# Patient Record
Sex: Female | Born: 1980 | Hispanic: Yes | Marital: Married
Health system: Southern US, Community
[De-identification: ages and names within clinical notes are randomized; demographics above are authoritative.]

---

## 2005-12-29 ENCOUNTER — Ambulatory Visit: Payer: Self-pay | Admitting: Family Medicine

## 2006-05-18 ENCOUNTER — Inpatient Hospital Stay: Payer: Self-pay

## 2010-10-07 ENCOUNTER — Emergency Department: Payer: Self-pay | Admitting: Emergency Medicine

## 2011-10-17 ENCOUNTER — Ambulatory Visit: Payer: Self-pay | Admitting: Family Medicine

## 2011-12-02 IMAGING — US US OB < 14 WEEKS - US OB TV
1 series · 17 of 28 positions shown · non-contrast
Comparison: none

REASON FOR EXAM: vaginal bleeding
COMMENTS:

[Series 1: us ob < 14 weeks - us ob tv · 17 of 38 slices shown]
[im 1/38]
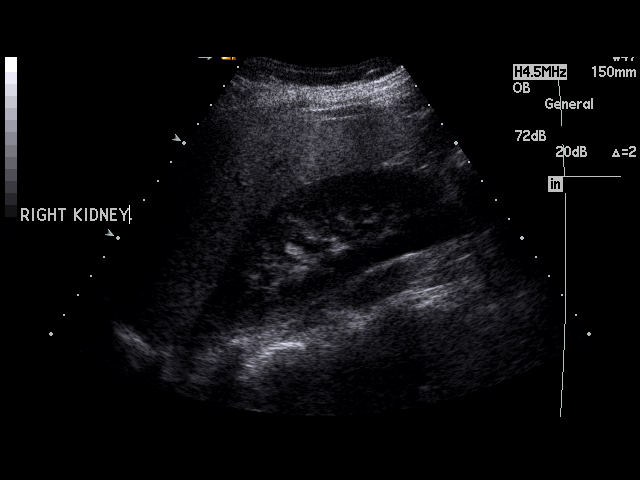
[im 3/38]
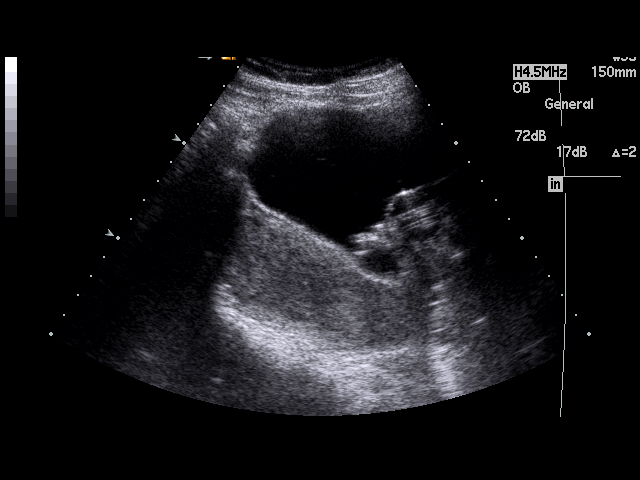
[im 6/38]
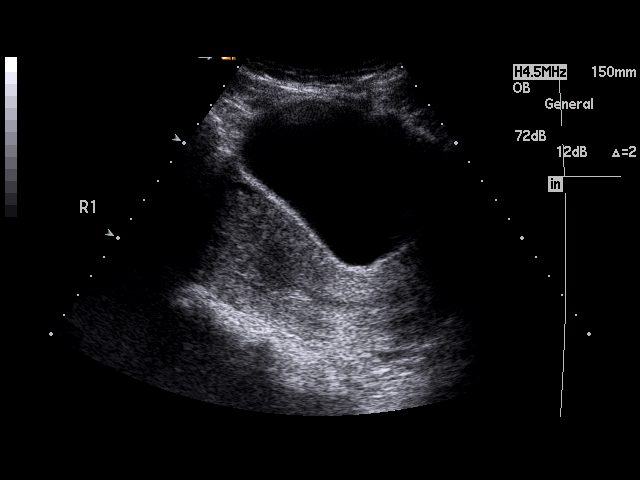
[im 7/38]
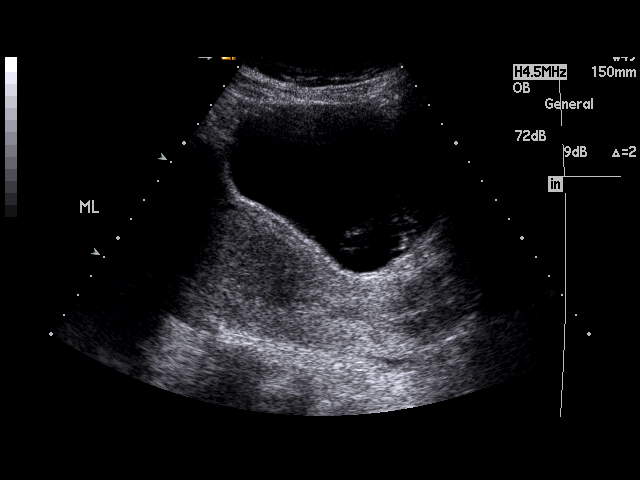
[im 10/38]
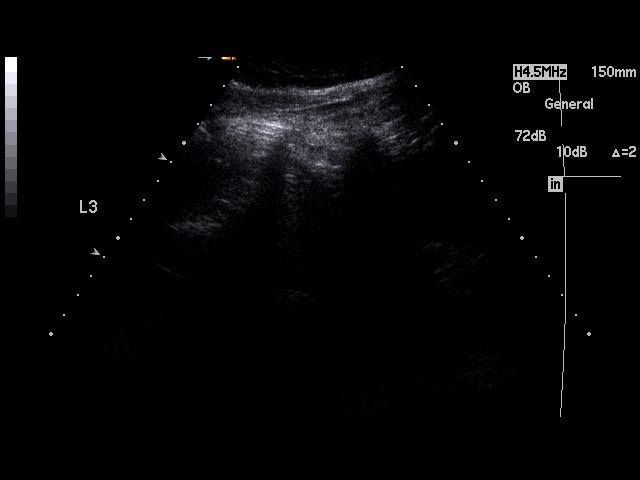
[im 13/38]
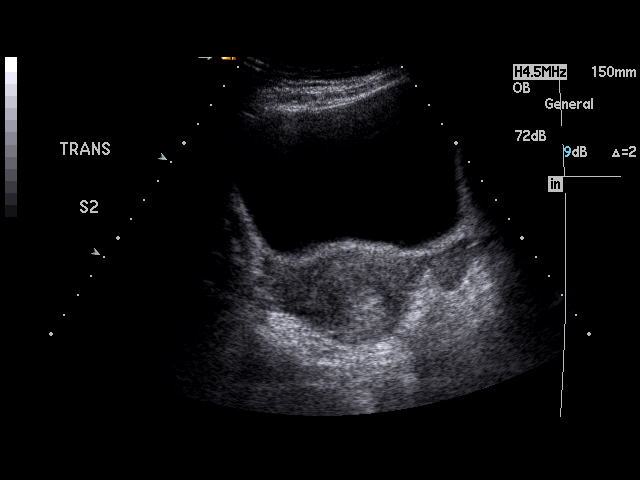
[im 14/38]
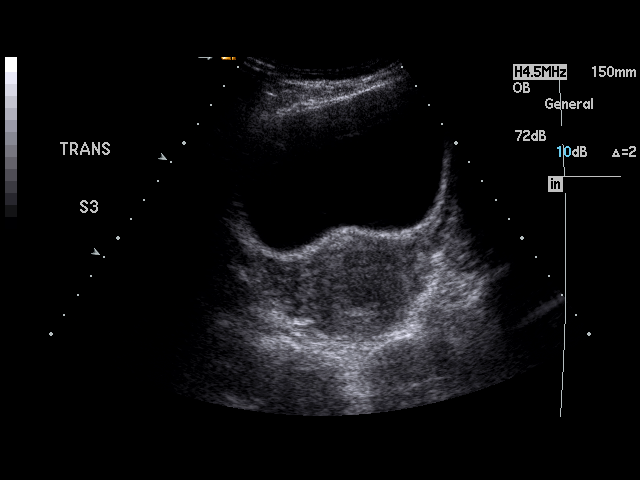
[im 17/38]
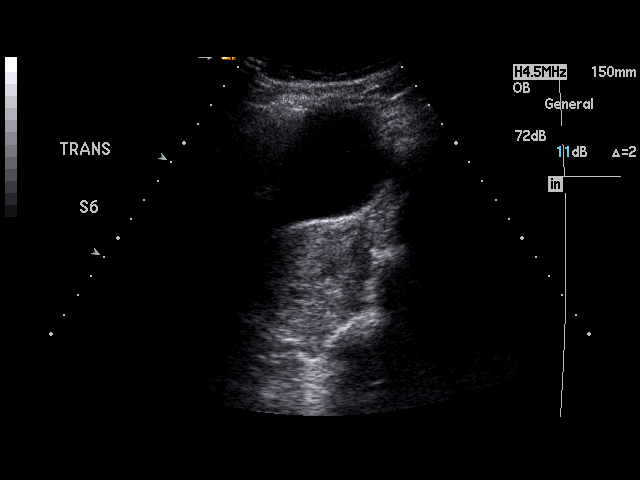
[im 20/38]
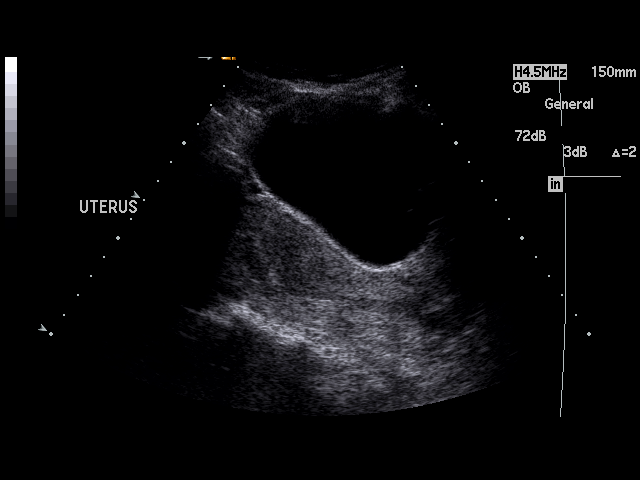
[im 21/38]
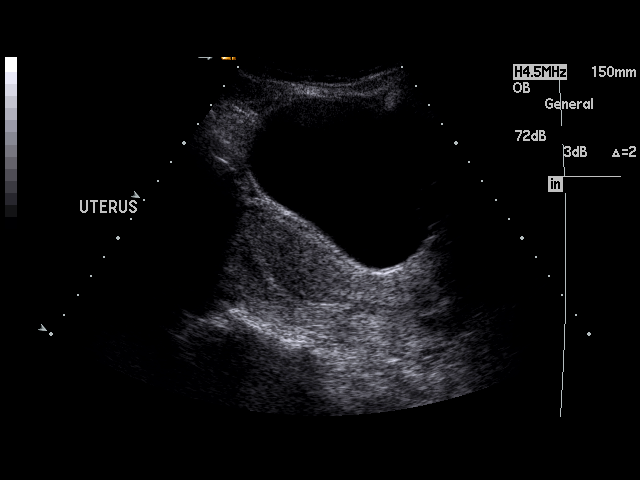
[im 24/38]
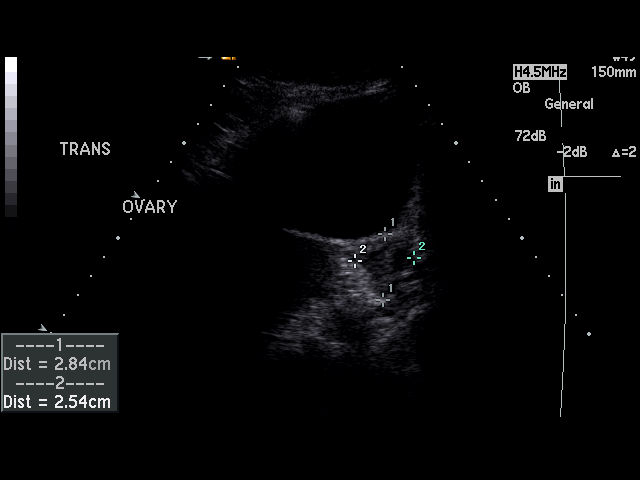
[im 25/38]
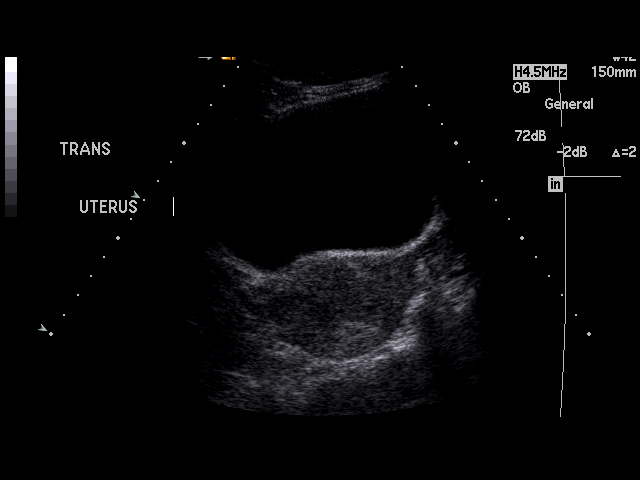
[im 28/38]
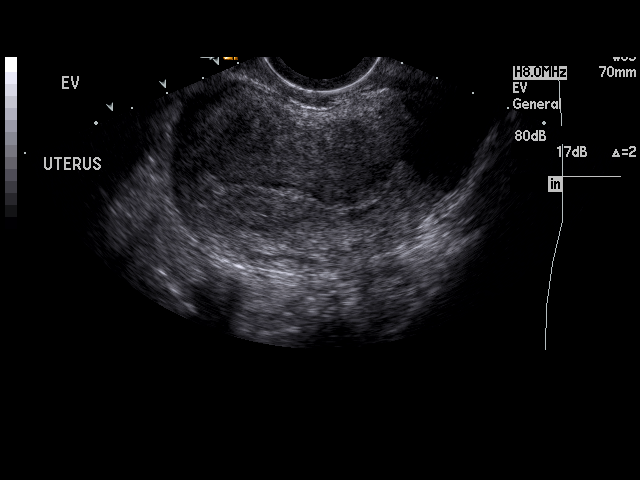
[im 31/38]
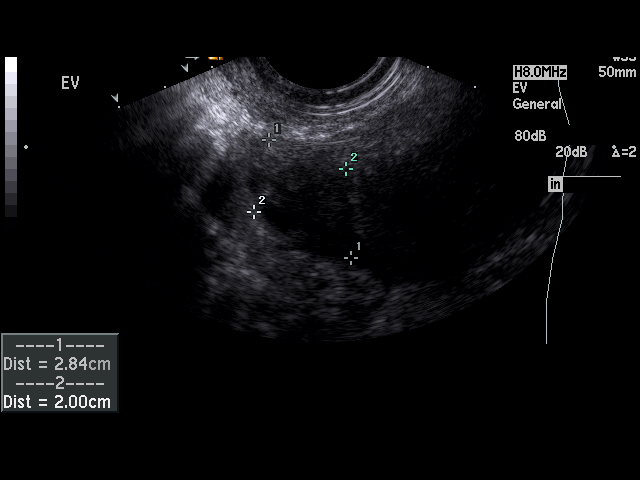
[im 32/38]
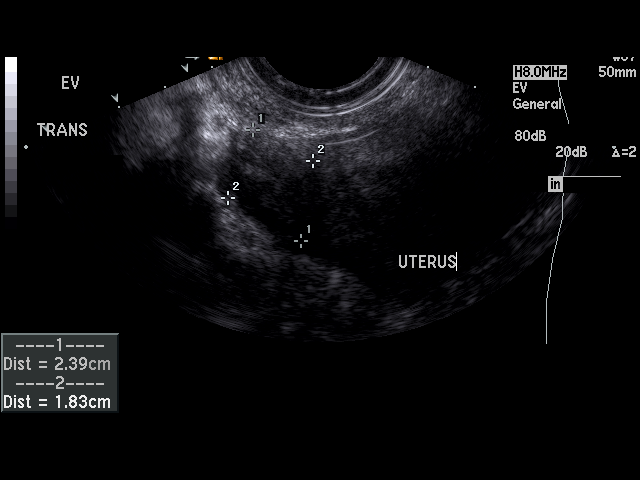
[im 35/38]
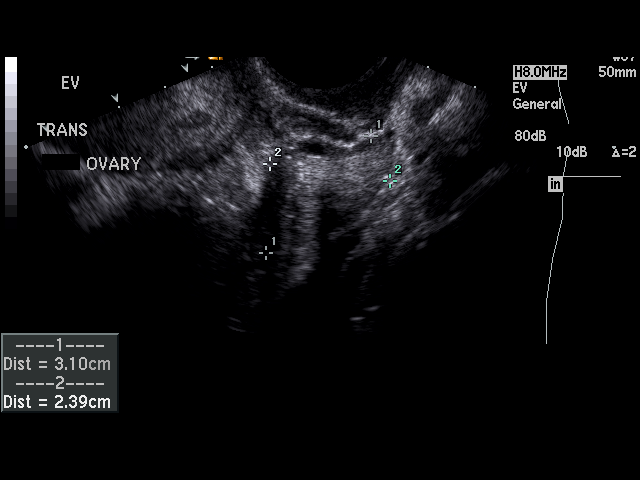
[im 38/38]
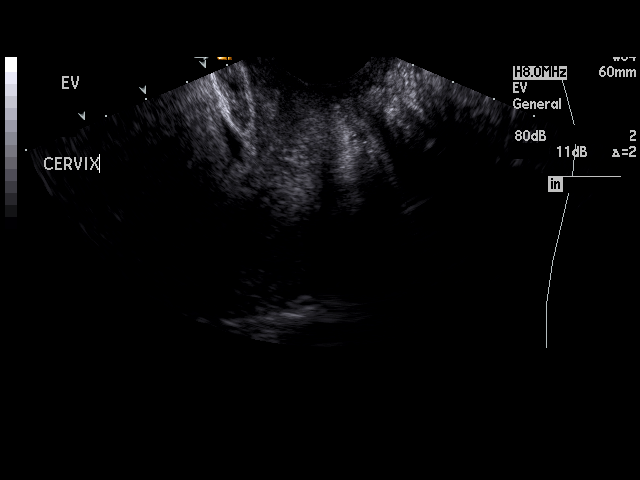

[17 of 28 positions shown; findings below may reference images not displayed]

PROCEDURE:     US  - US OB LESS THAN 14 WEEKS/W TRANS  - October 07, 2010 [DATE]

RESULT:     Pelvic ultrasound is performed utilizing transabdominal and
endovaginal scanning. The endometrial stripe is 12 mm thick. There is a
small amount of fluid or debris within the endometrial canal. No gestational
sac is seen within the endometrial cavity. The ovaries appear within normal
limits of size and echotexture as does the uterus. The kidneys appear to be
unremarkable. Doppler interrogation of the ovaries is not documented. The
uterine size was not measured.
IMPRESSION: No identifiable gestation. Given the findings of the
positive beta-hCG, a recent abortion or ectopic gestation should be
considered. Serial quantitative beta-hCG would be recommended along with
possible follow-up ultrasound. OB consultation is suggested.

## 2011-12-30 ENCOUNTER — Ambulatory Visit: Payer: Self-pay | Admitting: Family Medicine

## 2012-04-06 ENCOUNTER — Ambulatory Visit: Payer: Self-pay | Admitting: Advanced Practice Midwife

## 2012-06-18 ENCOUNTER — Inpatient Hospital Stay: Payer: Self-pay

## 2012-06-18 LAB — CBC WITH DIFFERENTIAL/PLATELET
Basophil #: 0 x10 3/mm 3
Basophil %: 0.2 %
Eosinophil #: 0.1 x10 3/mm 3
Eosinophil %: 1.1 %
HCT: 37.6 %
HGB: 12.7 g/dL
Lymphocyte %: 23.3 %
Lymphs Abs: 2.6 x10 3/mm 3
MCH: 28.3 pg
MCHC: 33.9 g/dL
MCV: 83 fL
Monocyte #: 1 "x10 3/mm " — ABNORMAL HIGH
Monocyte %: 8.8 %
Neutrophil #: 7.5 x10 3/mm 3 — ABNORMAL HIGH
Neutrophil %: 66.6 %
Platelet: 147 x10 3/mm 3 — ABNORMAL LOW
RBC: 4.51 X10 6/mm 3
RDW: 17.1 % — ABNORMAL HIGH
WBC: 11.3 x10 3/mm 3 — ABNORMAL HIGH

## 2012-06-19 LAB — HEMATOCRIT: HCT: 33.8 % — ABNORMAL LOW (ref 35.0–47.0)

## 2013-02-23 IMAGING — US US OB US >=[ID] SNGL FETUS
1 of 2 series · 13 of 28 positions shown · non-contrast
Comparison: none

REASON FOR EXAM: dating
COMMENTS:

[Series 1: us ob us >=(id) sngl fetus · 0.37mm/px · 13 of 54 slices shown]
[im 3/54]
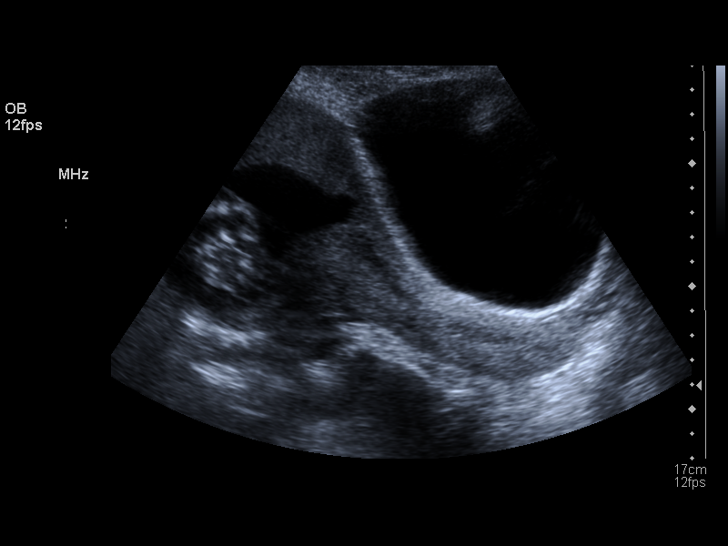
[im 7/54]
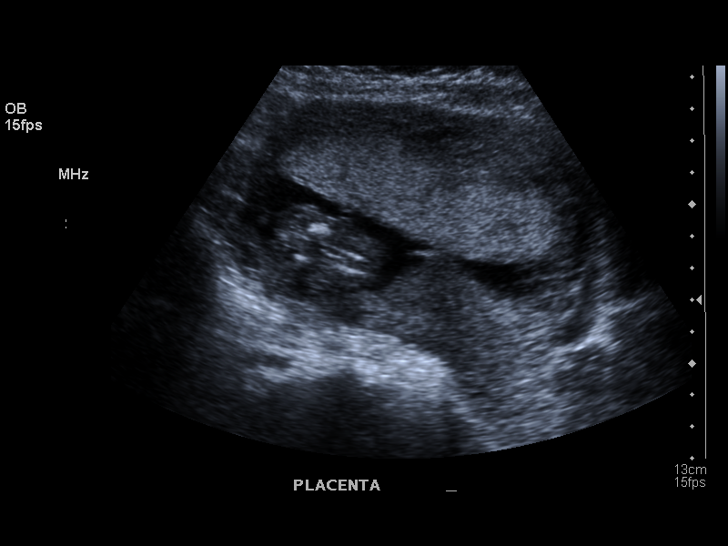
[im 11/54]
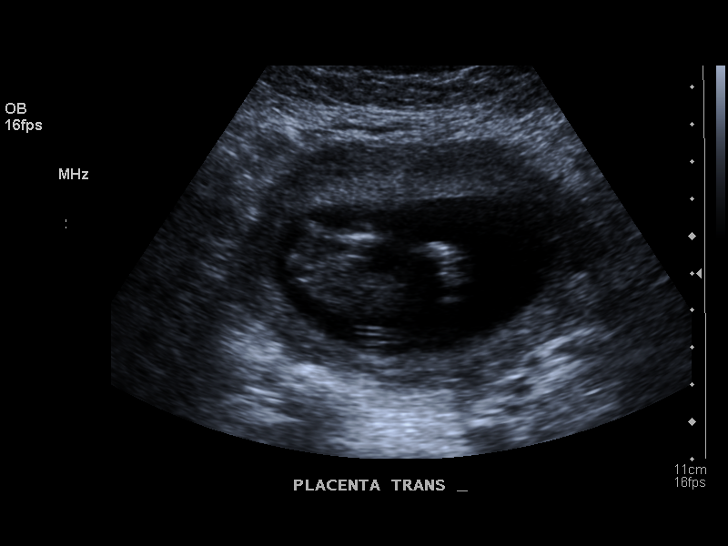
[im 15/54]
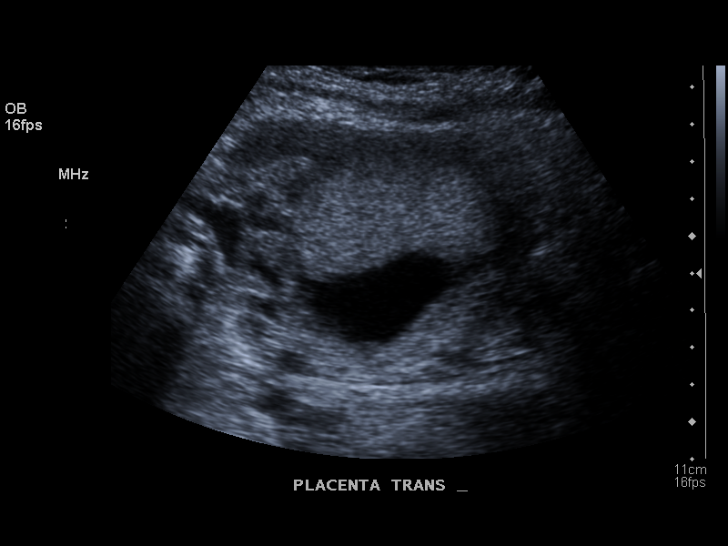
[im 19/54]
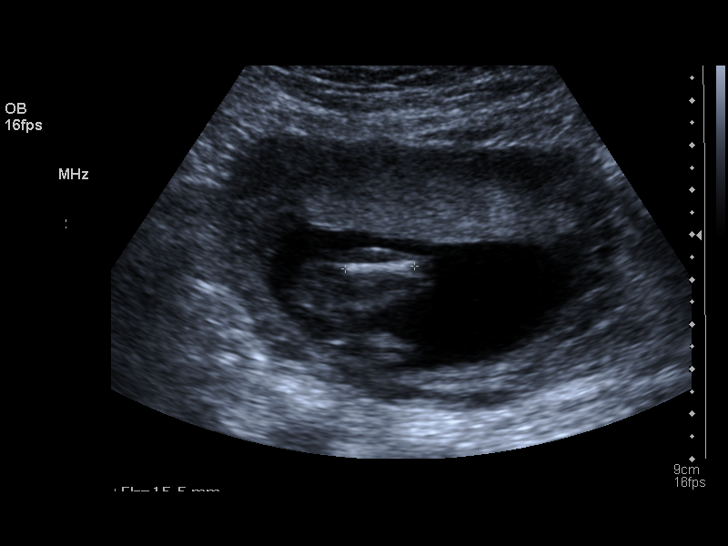
[im 23/54]
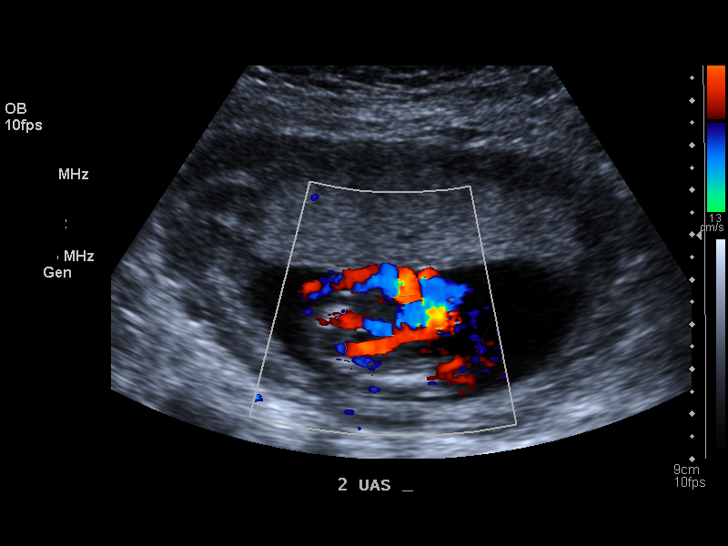
[im 29/54]
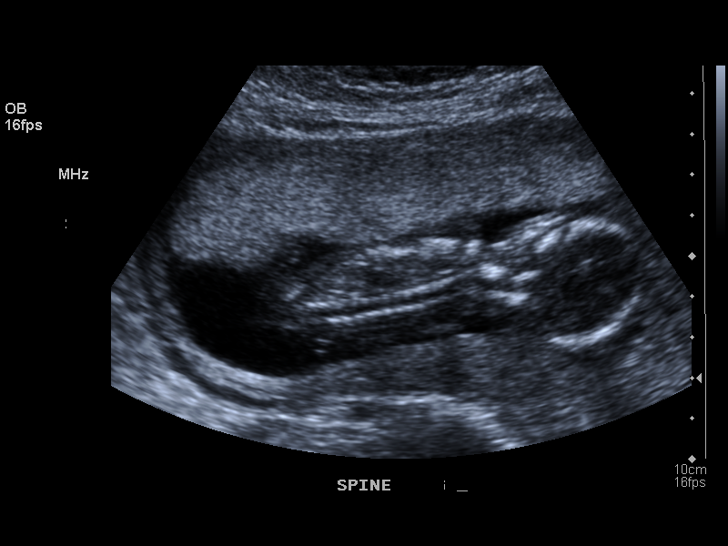
[im 33/54]
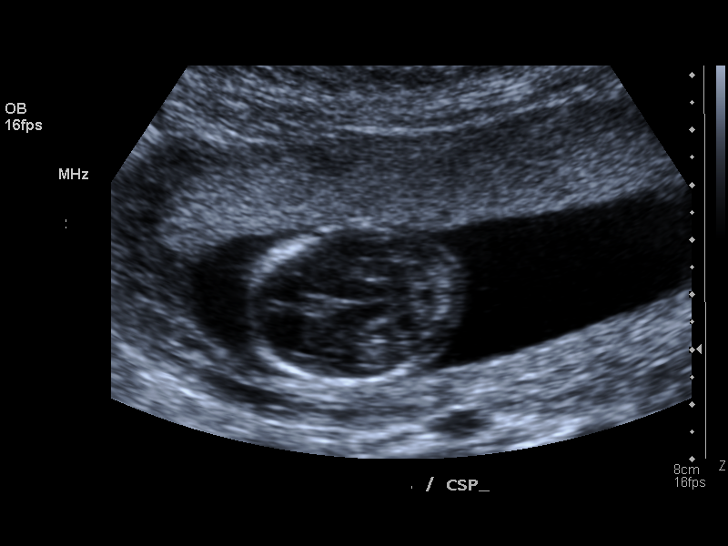
[im 37/54]
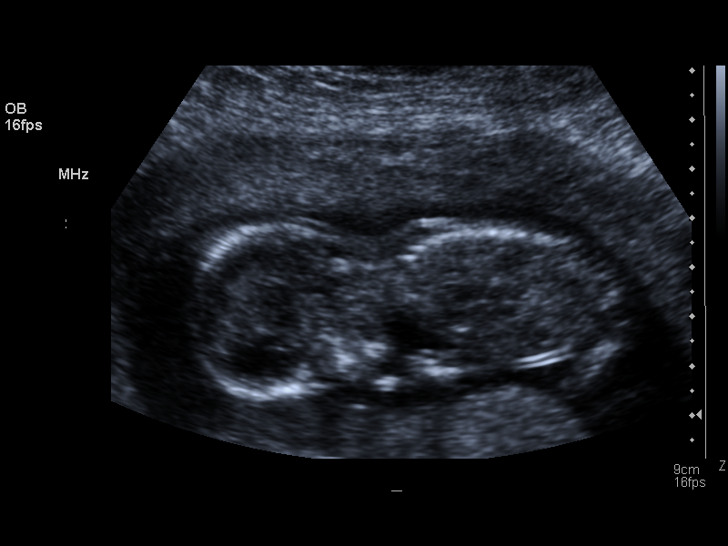
[im 41/54]
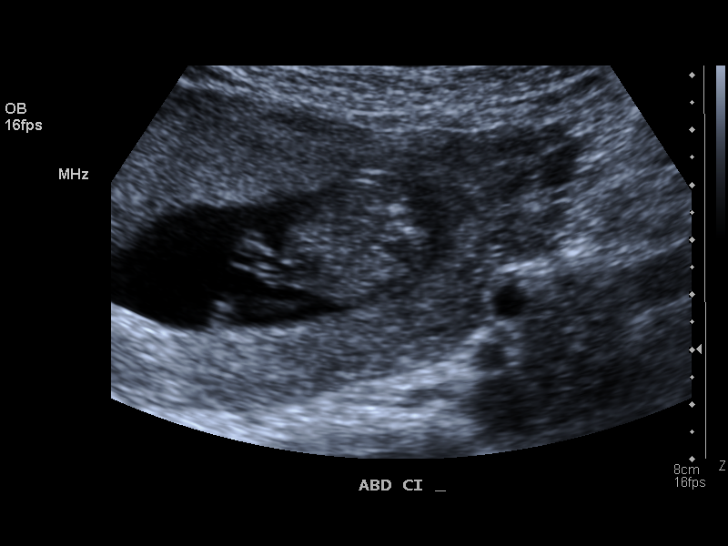
[im 45/54]
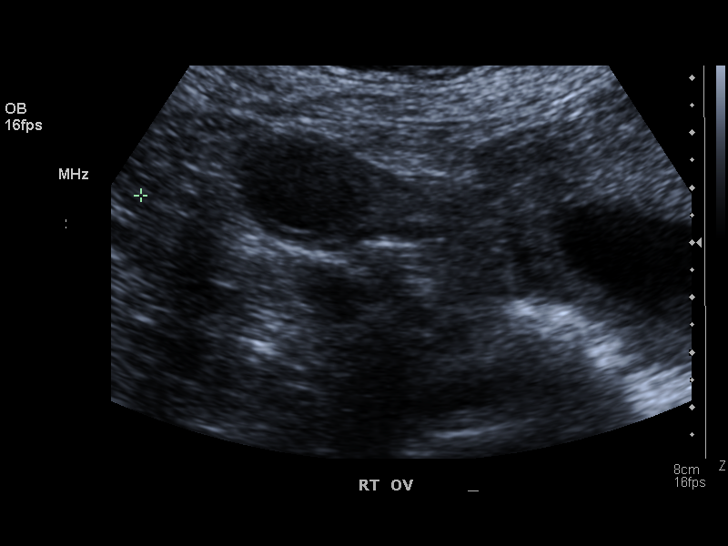
[im 49/54]
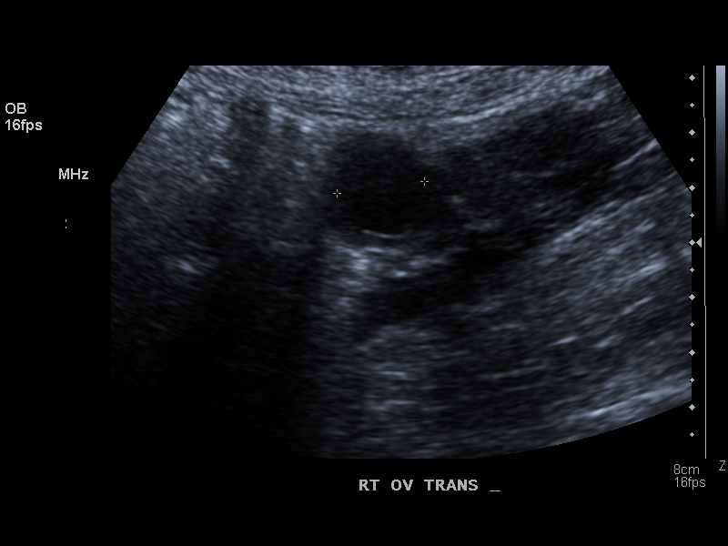
[im 54/54]
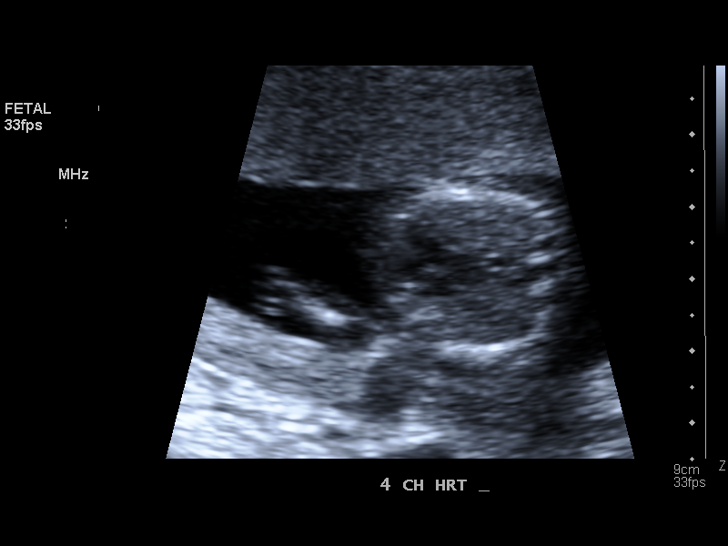

[13 of 28 positions shown; findings below may reference images not displayed]

PROCEDURE:     US  - US OB GREATER/OR EQUAL TO FYSBR  - December 30, 2011 [DATE]

RESULT:     There is observed a single living intrauterine gestation. Fetal
heart rate was monitored at 150 beats per minute. Amnionic fluid volume
appears normal. Presentation currently is cephalic. The placenta is
anterior. The inferior margin of the placenta currently is approximately
1.99 cm above the cervix. The placenta therefore is currently low-lying.
Cervix length measures 4.11 cm. The fetal heart, stomach and urinary bladder
are visualized. No hydrocephalus or hydronephrosis is seen. Fetal parts are
too small to evaluate at this point and could be better evaluated at 18 to
20 weeks. Fetal measurements are as follows:

BPD: 28.5 mm corresponding to 15 weeks-0 day
HC: 104.2 mm corresponding to 14 weeks-3 days
AC: 87.5 mm corresponding to 15 weeks-7 days
FL: 15.5 mm corresponding to 14 weeks-2 days
HL: 15.6 mm corresponding to 14 weeks-9 days

Average ultrasound age is 14 weeks-3 days. Ultrasound EDD is 06/23/2012.
IMPRESSION: 1. There is observed a single living intrauterine gestation.
2. The placenta currently is 1.99 cm above the cervix.
3. Menstrual age by ultrasound is approximately 14 weeks-3 days.
4. Ultrasound EDD is 06/23/2012.

## 2014-07-21 ENCOUNTER — Ambulatory Visit: Payer: Self-pay | Admitting: Advanced Practice Midwife

## 2014-12-12 ENCOUNTER — Inpatient Hospital Stay: Payer: Self-pay

## 2014-12-12 LAB — CBC WITH DIFFERENTIAL/PLATELET
BASOS ABS: 0 10*3/uL (ref 0.0–0.1)
BASOS PCT: 0.2 %
EOS ABS: 0.1 10*3/uL (ref 0.0–0.7)
EOS PCT: 0.7 %
HCT: 40.6 % (ref 35.0–47.0)
HGB: 13.1 g/dL (ref 12.0–16.0)
LYMPHS PCT: 26 %
Lymphocyte #: 2 10*3/uL (ref 1.0–3.6)
MCH: 27.2 pg (ref 26.0–34.0)
MCHC: 32.2 g/dL (ref 32.0–36.0)
MCV: 85 fL (ref 80–100)
Monocyte #: 0.5 x10 3/mm (ref 0.2–0.9)
Monocyte %: 6.4 %
NEUTROS PCT: 66.7 %
Neutrophil #: 5.2 10*3/uL (ref 1.4–6.5)
Platelet: 144 10*3/uL — ABNORMAL LOW (ref 150–440)
RBC: 4.8 10*6/uL (ref 3.80–5.20)
RDW: 17.3 % — ABNORMAL HIGH (ref 11.5–14.5)
WBC: 7.8 10*3/uL (ref 3.6–11.0)

## 2014-12-13 LAB — CBC WITH DIFFERENTIAL/PLATELET
Basophil #: 0 10*3/uL (ref 0.0–0.1)
Basophil %: 0.3 %
Eosinophil #: 0.1 10*3/uL (ref 0.0–0.7)
Eosinophil %: 0.5 %
HCT: 39.2 % (ref 35.0–47.0)
HGB: 12.8 g/dL (ref 12.0–16.0)
LYMPHS PCT: 21.2 %
Lymphocyte #: 2.6 10*3/uL (ref 1.0–3.6)
MCH: 27 pg (ref 26.0–34.0)
MCHC: 32.8 g/dL (ref 32.0–36.0)
MCV: 82 fL (ref 80–100)
MONOS PCT: 7.4 %
Monocyte #: 0.9 x10 3/mm (ref 0.2–0.9)
Neutrophil #: 8.7 10*3/uL — ABNORMAL HIGH (ref 1.4–6.5)
Neutrophil %: 70.6 %
PLATELETS: 142 10*3/uL — AB (ref 150–440)
RBC: 4.76 10*6/uL (ref 3.80–5.20)
RDW: 17.5 % — AB (ref 11.5–14.5)
WBC: 12.4 10*3/uL — AB (ref 3.6–11.0)

## 2014-12-14 LAB — HEMOGLOBIN: HGB: 11.3 g/dL — ABNORMAL LOW (ref 12.0–16.0)

## 2014-12-14 LAB — HEMATOCRIT: HCT: 35.4 % (ref 35.0–47.0)

## 2015-03-02 LAB — SURGICAL PATHOLOGY

## 2015-03-08 NOTE — Op Note (Signed)
PATIENT NAME:  Teresa MitchellBOLAINA OSORIO, [name] MR#:  [id number] DATE OF BIRTH:  [birth year]  DATE OF PROCEDURE:  12/13/2014  PREOPERATIVE DIAGNOSIS: Elective permanent sterilization.   POSTOPERATIVE DIAGNOSIS: Elective permanent sterilization.   PROCEDURE: Pomeroy bilateral tubal ligation.   ANESTHESIA: General endotracheal anesthesia.   SURGEON: Suzy Bouchardhomas J. Schermerhorn, MD  INDICATIONS: A 34 year old gravida 5, now para 4. The patient is status post uncomplicated spontaneous vaginal delivery, approximately 11 hours prior to the surgery. The patient was re-consented for the permanent nature of the procedure. The patient, via the translator, agrees and all appropriate questions were asked and answered.   PROCEDURE IN DETAIL: After adequate general endotracheal anesthesia, the patient was placed in the dorsal supine position. The patient's abdomen was prepped and draped in normal sterile fashion. A 15 mm infraumbilical incision was made after injecting with 0.5% Marcaine. The fascia, which was weakened and attenuated, was identified and opened, and the peritoneum was opened as well, without difficulty. The patient was placed in Trendelenburg, and the right fallopian tube was grasped with a Babcock clamp and the fimbriated end was visualized. Two separate 0 plain gut sutures were placed in the midportion of the fallopian tube, followed by removal of a 1.5 cm portion of fallopian tube. Good hemostasis was noted. A similar procedure was repeated on the patient's left fallopian tube. After visualizing the fimbriated end, 2 separate 0 plain gut sutures were placed in the midportion of the fallopian tube and a 1.5 cm portion of the fallopian tube was removed. Good hemostasis was noted. Preperitoneal fat was trimmed from the umbilical incision after clamping and removing, and attenuated fascia was reapproximated centrally in a transverse fashion with a 2-0 Vicryl suture. The skin was reapproximated with interrupted 4-0  Vicryl suture. LiquiBand been was placed to the skin and covered it with Tegaderm. There were no complications. The patient tolerated the procedure well. Estimated blood loss: Minimal. Intraoperative fluids: 800 mL The patient was taken to recovery room in good condition.    ____________________________ Suzy Bouchardhomas J. Schermerhorn, MD tjs:mw D: 12/13/2014 12:22:33 ET T: 12/13/2014 13:12:51 ET JOB#: [id number]  cc: Suzy Bouchardhomas J. Schermerhorn, MD, <Dictator> Suzy BouchardHOMAS J SCHERMERHORN MD ELECTRONICALLY SIGNED 12/18/2014 22:31

## 2015-03-17 NOTE — H&P (Signed)
L&D Evaluation:  History:  HPI 34 y/o G5P3013 @ 39wks EDC 12/19/14 here from [facility] with c/o SROM 2100 12/11/14. Occasional uc's, baby is active. GBS negative.   Presents with leaking fluid   Patient's Medical History No Chronic Illness   Patient's Surgical History none   Medications Pre Natal Vitamins   Allergies NKDA   Social History none   Family History Non-Contributory   ROS:  ROS All systems were reviewed.  HEENT, CNS, GI, GU, Respiratory, CV, Renal and Musculoskeletal systems were found to be normal.   Exam:  Vital Signs stable   Urine Protein not completed   General no apparent distress   Mental Status clear   Chest clear   Heart normal sinus rhythm   Abdomen gravid, non-tender   Estimated Fetal Weight Average for gestational age   Fetal Position vtx   Fundal Height term   Back no CVAT   Edema no edema   Reflexes 1+   Clonus negative   Pelvic no external lesions, 5cm 80% vtx @ -2 leaking clear fluid nl show Forebag ruptured.   Mebranes Ruptured, 2100-12/11/14   Description clear   FHT normal rate with no decels, baseline 130's 140's accels avg variability   Fetal Heart Rate 144   Ucx Q 2/4 mins 45 sec mild   Skin dry   Lymph no lymphadenopathy   Impression:  Impression early labor, PROM   Plan:  Plan EFM/NST, monitor contractions and for cervical change   Comments Admitted, knows what to expect. Declines pain meds or epidural. Will begin pitocin augment and IV ABX   Electronic Signatures: Albertina ParrLugiano, Shahrukh Pasch B (CNM)  (Signed [phone]-Feb-16 22:22)  Authored: L&D Evaluation   Last Updated: 05-Feb-16 22:22 by Albertina ParrLugiano, Phaedra Colgate B (CNM)

## 2015-03-17 NOTE — H&P (Signed)
L&D Evaluation:  History:   HPI 34 y/o G4 P2012 @ 38/4wks EDC 06/26/12 arrives with c/o regular strong contractions leaking clear fluid and small bloody show, baby isa c tice. care @ [facility] well pregnancy HX pre/eclampsia, hx pp depression GBS +    Presents with contractions    Patient's Medical History Asthma    Patient's Surgical History none    Medications Pre Natal Vitamins    Allergies NKDA    Social History none    Family History Non-Contributory   ROS:   ROS All systems were reviewed.  HEENT, CNS, GI, GU, Respiratory, CV, Renal and Musculoskeletal systems were found to be normal.   Exam:   Vital Signs stable    Urine Protein not completed    General no apparent distress    Mental Status clear    Chest clear    Heart normal sinus rhythm    Abdomen gravid, non-tender    Estimated Fetal Weight Average for gestational age    Fetal Position vtx    Fundal Height term    Back no CVAT    Edema no edema    Reflexes 1+    Clonus positive    Pelvic no external lesions, rapid progress from 5cm to 8cm vtx @ -1  clear fluid nl show    Description clear    FHT normal rate with no decels, 140's avg variability occ acccels    Ucx regular, Q 2/3 mins 60 sec strong    Skin dry    Lymph no lymphadenopathy   Impression:   Impression active labor   Plan:   Comments Admitted knows what to expect 3rd baby, declines pain meds.Breathing through uc's well. Partner supportive, at bedside.   Electronic Signatures: Albertina ParrLugiano, Zannie Locastro B (CNM)  (Signed 12-Aug-13 01:44)  Authored: L&D Evaluation   Last Updated: 12-Aug-13 01:44 by Albertina ParrLugiano, Paticia Moster B (CNM)
